# Patient Record
Sex: Female | Born: 1970 | Race: Black or African American | Hispanic: No | Marital: Single | Smoking: Never smoker
Health system: Southern US, Community
[De-identification: ages and names within clinical notes are randomized; demographics above are authoritative.]

## PROBLEM LIST (undated history)

## (undated) DIAGNOSIS — R739 Hyperglycemia, unspecified: Secondary | ICD-10-CM

## (undated) DIAGNOSIS — I10 Essential (primary) hypertension: Secondary | ICD-10-CM

## (undated) DIAGNOSIS — E7800 Pure hypercholesterolemia, unspecified: Secondary | ICD-10-CM

## (undated) HISTORY — DX: Pure hypercholesterolemia, unspecified: E78.00

## (undated) HISTORY — DX: Hyperglycemia, unspecified: R73.9

## (undated) HISTORY — DX: Essential (primary) hypertension: I10

---

## 2000-11-25 ENCOUNTER — Other Ambulatory Visit: Admission: RE | Admit: 2000-11-25 | Discharge: 2000-11-25 | Payer: Self-pay | Admitting: Family Medicine

## 2003-02-21 ENCOUNTER — Other Ambulatory Visit: Admission: RE | Admit: 2003-02-21 | Discharge: 2003-02-21 | Payer: Self-pay | Admitting: Family Medicine

## 2004-04-15 ENCOUNTER — Other Ambulatory Visit: Admission: RE | Admit: 2004-04-15 | Discharge: 2004-04-15 | Payer: Self-pay | Admitting: Family Medicine

## 2005-02-03 ENCOUNTER — Encounter: Admission: RE | Admit: 2005-02-03 | Discharge: 2005-02-03 | Payer: Self-pay | Admitting: Family Medicine

## 2005-04-28 ENCOUNTER — Other Ambulatory Visit: Admission: RE | Admit: 2005-04-28 | Discharge: 2005-04-28 | Payer: Self-pay | Admitting: Family Medicine

## 2005-05-06 ENCOUNTER — Encounter: Admission: RE | Admit: 2005-05-06 | Discharge: 2005-05-06 | Payer: Self-pay | Admitting: Family Medicine

## 2006-03-16 ENCOUNTER — Encounter: Admission: RE | Admit: 2006-03-16 | Discharge: 2006-03-16 | Payer: Self-pay | Admitting: Family Medicine

## 2006-10-07 ENCOUNTER — Encounter: Admission: RE | Admit: 2006-10-07 | Discharge: 2006-10-07 | Payer: Self-pay | Admitting: Family Medicine

## 2007-10-18 ENCOUNTER — Encounter: Admission: RE | Admit: 2007-10-18 | Discharge: 2007-10-18 | Payer: Self-pay | Admitting: Family Medicine

## 2012-06-07 HISTORY — PX: TOTAL LAPAROSCOPIC HYSTERECTOMY WITH SALPINGECTOMY: SHX6742

## 2012-06-07 HISTORY — PX: HYSTERECTOMY ABDOMINAL WITH SALPINGECTOMY: SHX6725

## 2018-08-12 ENCOUNTER — Emergency Department (HOSPITAL_BASED_OUTPATIENT_CLINIC_OR_DEPARTMENT_OTHER): Payer: BC Managed Care – PPO

## 2018-08-12 ENCOUNTER — Emergency Department (HOSPITAL_BASED_OUTPATIENT_CLINIC_OR_DEPARTMENT_OTHER)
Admission: EM | Admit: 2018-08-12 | Discharge: 2018-08-12 | Disposition: A | Discharge status: Home / Self Care | Payer: BC Managed Care – PPO | Attending: Emergency Medicine | Admitting: Emergency Medicine

## 2018-08-12 ENCOUNTER — Other Ambulatory Visit: Payer: Self-pay

## 2018-08-12 ENCOUNTER — Encounter (HOSPITAL_BASED_OUTPATIENT_CLINIC_OR_DEPARTMENT_OTHER): Payer: Self-pay | Admitting: *Deleted

## 2018-08-12 DIAGNOSIS — M25532 Pain in left wrist: Secondary | ICD-10-CM | POA: Diagnosis present

## 2018-08-12 DIAGNOSIS — M79602 Pain in left arm: Secondary | ICD-10-CM | POA: Diagnosis not present

## 2018-08-12 DIAGNOSIS — Z79899 Other long term (current) drug therapy: Secondary | ICD-10-CM | POA: Diagnosis not present

## 2018-08-12 LAB — CBC WITH DIFFERENTIAL/PLATELET
Abs Immature Granulocytes: 0.03 10*3/uL (ref 0.00–0.07)
Basophils Absolute: 0 10*3/uL (ref 0.0–0.1)
Basophils Relative: 1 %
Eosinophils Absolute: 0.1 10*3/uL (ref 0.0–0.5)
Eosinophils Relative: 2 %
HCT: 39 % (ref 36.0–46.0)
Hemoglobin: 12.1 g/dL (ref 12.0–15.0)
Immature Granulocytes: 0 %
Lymphocytes Relative: 38 %
Lymphs Abs: 3.2 10*3/uL (ref 0.7–4.0)
MCH: 25.4 pg — ABNORMAL LOW (ref 26.0–34.0)
MCHC: 31 g/dL (ref 30.0–36.0)
MCV: 81.9 fL (ref 80.0–100.0)
Monocytes Absolute: 0.5 10*3/uL (ref 0.1–1.0)
Monocytes Relative: 6 %
Neutro Abs: 4.6 10*3/uL (ref 1.7–7.7)
Neutrophils Relative %: 53 %
Platelets: 210 10*3/uL (ref 150–400)
RBC: 4.76 MIL/uL (ref 3.87–5.11)
RDW: 14.1 % (ref 11.5–15.5)
Smear Review: ADEQUATE
WBC: 8.5 10*3/uL (ref 4.0–10.5)
nRBC: 0 % (ref 0.0–0.2)

## 2018-08-12 LAB — BASIC METABOLIC PANEL
Anion gap: 8 (ref 5–15)
BUN: 8 mg/dL (ref 6–20)
CO2: 22 mmol/L (ref 22–32)
Calcium: 9.3 mg/dL (ref 8.9–10.3)
Chloride: 105 mmol/L (ref 98–111)
Creatinine, Ser: 0.91 mg/dL (ref 0.44–1.00)
GFR calc Af Amer: >60 mL/min (ref >60)
GFR calc non Af Amer: >60 mL/min (ref >60)
Glucose, Bld: 98 mg/dL (ref 70–99)
Potassium: 3.7 mmol/L (ref 3.5–5.1)
Sodium: 135 mmol/L (ref 135–145)

## 2018-08-12 MED ORDER — NAPROXEN 500 MG PO TABS: 500.0000 mg | ORAL_TABLET | Freq: Two times a day (BID) | ORAL | 0 refills | Status: DC

## 2018-08-12 NOTE — ED Notes (Signed)
RN attempted blood draw was unsuccessful. Another RN going to attempt

## 2018-08-12 NOTE — ED Notes (Signed)
Pt in radiology when rounding

## 2018-08-12 NOTE — ED Notes (Signed)
Blood draw attempted x 1

## 2018-08-12 NOTE — ED Triage Notes (Signed)
Pt has been having arm pain (tingling and numbness in her wrist for a week, then last night she thinks she slept on it wrong causing arm and wrist pain.

## 2018-08-12 NOTE — ED Notes (Signed)
Patient transported to Ultrasound 

## 2018-08-12 NOTE — Discharge Instructions (Addendum)
Work note provided.  Take the Naprosyn as directed for the next 7 days.  Wear the sling on the left arm to rested and for comfort.  Can take it off to shower.  Make an appointment to follow-up with sports medicine upstairs call on Monday.

## 2018-08-12 NOTE — ED Provider Notes (Signed)
[facility] EMERGENCY DEPARTMENT Provider Note   CSN: [id number] Arrival date & time: 08/12/18  1742    History   Chief Complaint Chief Complaint  Patient presents with  . Arm Pain    HPI Julie Frey is a 48 y.o. female.     No history of any injury.  Patient with a complaint of left wrist pain started about a week ago.  Now radiating into the forearm.  And also some discomfort in the shoulder.  Patient recently started a new job could be due to excessive use.  But no known injury.  No prior history of anything similar.  Patient had noticed some pain tongue tingling and numbness to the left wrist area for a week.  Now has some discomfort in the left shoulder no pain with range of motion at the left elbow.  But forearm and wrist with discomfort.  No discomfort with fingers.  No neck pain.  No shortness of breath.  No chest pain.     History reviewed. No pertinent past medical history.  There are no active problems to display for this patient.   History reviewed. No pertinent surgical history.   OB History   No obstetric history on file.      Home Medications    Prior to Admission medications   Medication Sig Start Date End Date Taking? Authorizing Provider  amLODipine (NORVASC) 10 MG tablet Take 10 mg by mouth daily.   Yes [provider]  pravastatin (PRAVACHOL) 20 MG tablet Take 20 mg by mouth daily.   Yes [provider]  naproxen (NAPROSYN) 500 MG tablet Take 1 tablet (500 mg total) by mouth 2 (two) times daily. 08/12/18   Vanetta Mulders, MD    Family History History reviewed. No pertinent family history.  Social History Social History   Tobacco Use  . Smoking status: Never Smoker  . Smokeless tobacco: Never Used  Substance Use Topics  . Alcohol use: Yes    Alcohol/week: 2.0 standard drinks    Types: 2 Glasses of wine per week  . Drug use: Never     Allergies   Patient has no known allergies.   Review of  Systems Review of Systems  Constitutional: Negative for chills and fever.  HENT: Negative for rhinorrhea and sore throat.   Eyes: Negative for visual disturbance.  Respiratory: Negative for cough and shortness of breath.   Cardiovascular: Negative for chest pain and leg swelling.  Gastrointestinal: Negative for abdominal pain, diarrhea, nausea and vomiting.  Genitourinary: Negative for dysuria.  Musculoskeletal: Positive for joint swelling. Negative for back pain and neck pain.  Skin: Negative for rash and wound.  Neurological: Negative for dizziness, light-headedness and headaches.  Hematological: Does not bruise/bleed easily.  Psychiatric/Behavioral: Negative for confusion.     Physical Exam Updated Vital Signs BP (!) 143/82 (BP Location: Right Arm)   Pulse 69   Temp 98.4 F (36.9 C) (Oral)   Resp 18   Ht 1.689 m (5' 6.5")   Wt 113.4 kg   SpO2 100%   BMI 39.75 kg/m   Physical Exam Vitals signs and nursing note reviewed.  Constitutional:      General: She is not in acute distress.    Appearance: Normal appearance. She is well-developed.  HENT:     Head: Normocephalic and atraumatic.     Mouth/Throat:     Mouth: Mucous membranes are moist.  Eyes:     Extraocular Movements: Extraocular movements intact.  Conjunctiva/sclera: Conjunctivae normal.     Pupils: Pupils are equal, round, and reactive to light.  Neck:     Musculoskeletal: Normal range of motion and neck supple.  Cardiovascular:     Rate and Rhythm: Normal rate and regular rhythm.     Heart sounds: Normal heart sounds. No murmur.  Pulmonary:     Effort: Pulmonary effort is normal. No respiratory distress.     Breath sounds: Normal breath sounds.  Abdominal:     General: Bowel sounds are normal.     Palpations: Abdomen is soft.     Tenderness: There is no abdominal tenderness.  Musculoskeletal:        General: Swelling and tenderness present.     Comments: Left hand wrist and forearm with some  swelling and increased warmth.  Good range of motion at the elbow some discomfort with range of motion of the shoulder.  No pain with range of motion of fingers some discomfort with range of motion of the wrist.  Cap refill intact radial pulses 2+.  Skin:    General: Skin is warm and dry.     Capillary Refill: Capillary refill takes less than 2 seconds.  Neurological:     General: No focal deficit present.     Mental Status: She is alert and oriented to person, place, and time.     Cranial Nerves: No cranial nerve deficit.     Sensory: No sensory deficit.     Motor: No weakness.      ED Treatments / Results  Labs (all labs ordered are listed, but only abnormal results are displayed) Labs Reviewed  CBC WITH DIFFERENTIAL/PLATELET - Abnormal; Notable for the following components:      Result Value   MCH 25.4 (*)    All other components within normal limits  BASIC METABOLIC PANEL    EKG None  Radiology Dg Forearm Left  Result Date: 08/12/2018 CLINICAL DATA:  Pain and swelling. Left wrist pain x 1 week that has now radiated up to left shoulder, no known injury. EXAM: LEFT FOREARM - 2 VIEW COMPARISON:  None. FINDINGS: There is no evidence of fracture or other focal bone lesions. Soft tissues are unremarkable. IMPRESSION: Negative radiographs of the left forearm. Electronically Signed   By: Narda Rutherford M.D.   On: 08/12/2018 19:50   Dg Wrist Complete Left  Result Date: 08/12/2018 CLINICAL DATA:  Pain and swelling. Left wrist pain x 1 week that has now radiated up to left shoulder, no known injury. EXAM: LEFT WRIST - COMPLETE 3+ VIEW COMPARISON:  None. FINDINGS: There is no evidence of fracture or dislocation. There is no evidence of arthropathy or other focal bone abnormality. Soft tissues are unremarkable. IMPRESSION: Negative radiographs of the left wrist. Electronically Signed   By: Narda Rutherford M.D.   On: 08/12/2018 19:51   US Venous Img Upper Left (dvt Study)  Result Date:  08/12/2018 CLINICAL DATA:  Pain and swelling. Left wrist pain x 1 week that has now radiated up to left shoulder, no known injury. EXAM: LEFT UPPER EXTREMITY VENOUS DOPPLER ULTRASOUND TECHNIQUE: Gray-scale sonography with graded compression, as well as color Doppler and duplex ultrasound were performed to evaluate the upper extremity deep venous system from the level of the subclavian vein and including the jugular, axillary, basilic, radial, ulnar and upper cephalic vein. Spectral Doppler was utilized to evaluate flow at rest and with distal augmentation maneuvers. COMPARISON:  None. FINDINGS: Contralateral Subclavian Vein: Respiratory phasicity is normal and  symmetric with the symptomatic side. No evidence of thrombus. Normal compressibility. Internal Jugular Vein: No evidence of thrombus. Normal compressibility, respiratory phasicity and response to augmentation. Subclavian Vein: No evidence of thrombus. Normal compressibility, respiratory phasicity and response to augmentation. Axillary Vein: No evidence of thrombus. Normal compressibility, respiratory phasicity and response to augmentation. Cephalic Vein: No evidence of thrombus. Normal compressibility, respiratory phasicity and response to augmentation. Basilic Vein: No evidence of thrombus. Normal compressibility, respiratory phasicity and response to augmentation. Brachial Veins: No evidence of thrombus. Normal compressibility, respiratory phasicity and response to augmentation. Radial Veins: No evidence of thrombus. Normal compressibility, respiratory phasicity and response to augmentation. Ulnar Veins: No evidence of thrombus. Normal compressibility, respiratory phasicity and response to augmentation. Venous Reflux:  None visualized. Other Findings:  None visualized. IMPRESSION: No evidence of DVT within the left upper extremity. Electronically Signed   By: Narda Rutherford M.D.   On: 08/12/2018 19:53    Procedures Procedures (including critical care  time)  Medications Ordered in ED Medications - No data to display   Initial Impression / Assessment and Plan / ED Course  I have reviewed the triage vital signs and the nursing notes.  Pertinent labs & imaging results that were available during my care of the patient were reviewed by me and considered in my medical decision making (see chart for details).        Work-up for the left arm pain and swelling some increased warmth without any acute findings.  Doppler studies negative for DVT x-ray of wrist and forearm without any bony abnormalities.  Could perhaps be just overuse.  Labs are normal no leukocytosis electrolytes are normal kidney functions normal.  Will treat with a sling 7-day course of Naprosyn and follow-up with sports medicine.  Final Clinical Impressions(s) / ED Diagnoses   Final diagnoses:  Left arm pain    ED Discharge Orders         Ordered    naproxen (NAPROSYN) 500 MG tablet  2 times daily     08/12/18 2059           Vanetta Mulders, MD 08/12/18 2105

## 2018-08-21 ENCOUNTER — Ambulatory Visit: Payer: BC Managed Care – PPO | Admitting: Family Medicine

## 2018-08-28 ENCOUNTER — Ambulatory Visit: Payer: BC Managed Care – PPO | Admitting: Family Medicine

## 2020-03-03 IMAGING — CR DG WRIST COMPLETE 3+V*L*
4 series · 4 of 4 positions shown · non-contrast
Comparison: None.

CLINICAL DATA: Pain and swelling. Left wrist pain x 1 week that has
now radiated up to left shoulder, no known injury.

EXAM:
LEFT WRIST - COMPLETE 3+ VIEW

[x wrist pa left]
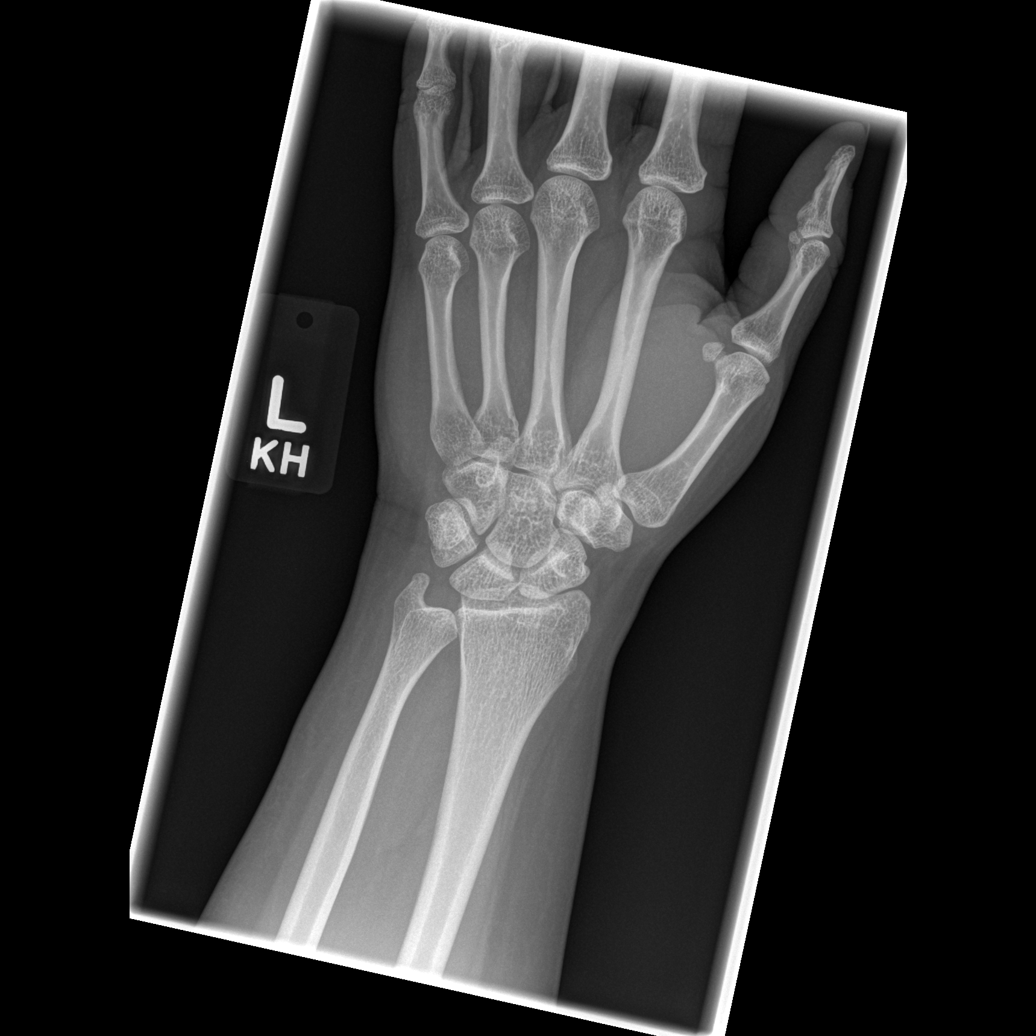

[x wrist obl left]
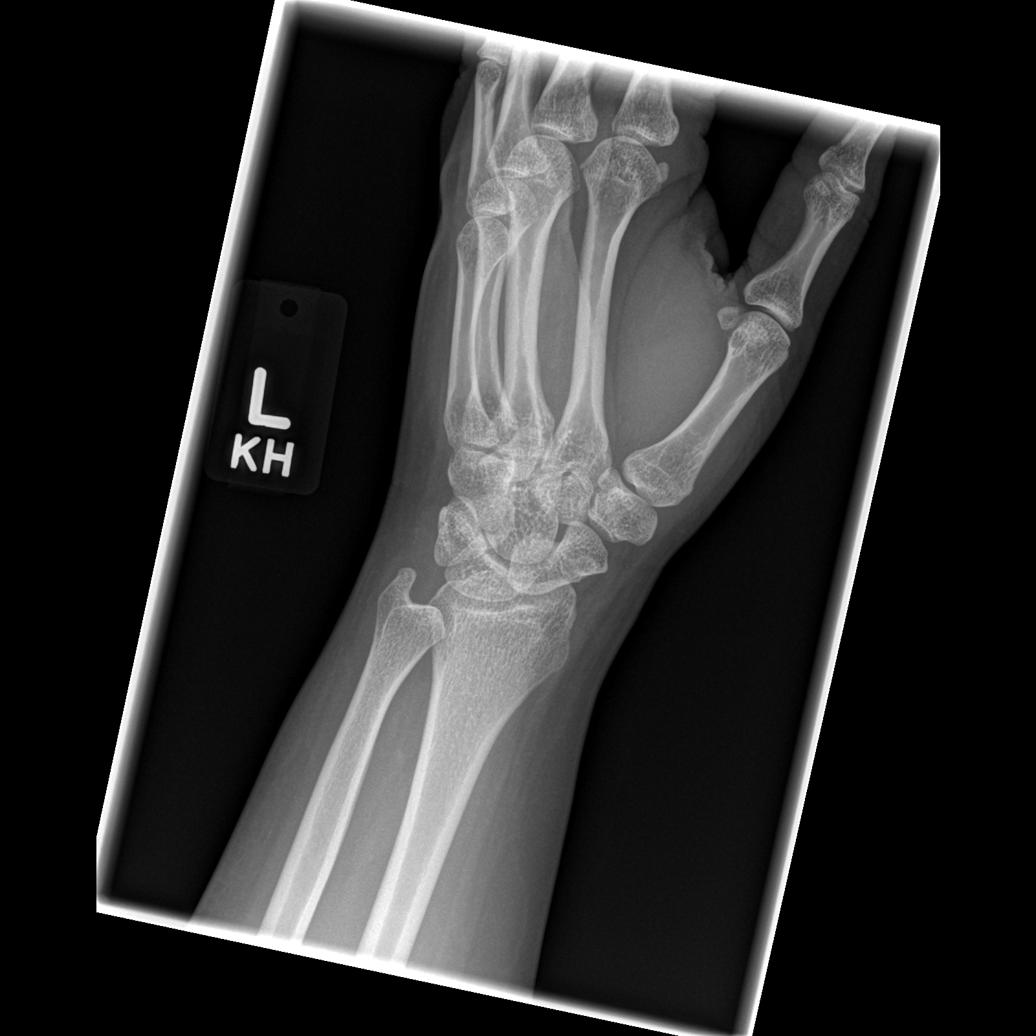

[x wrist lat left]
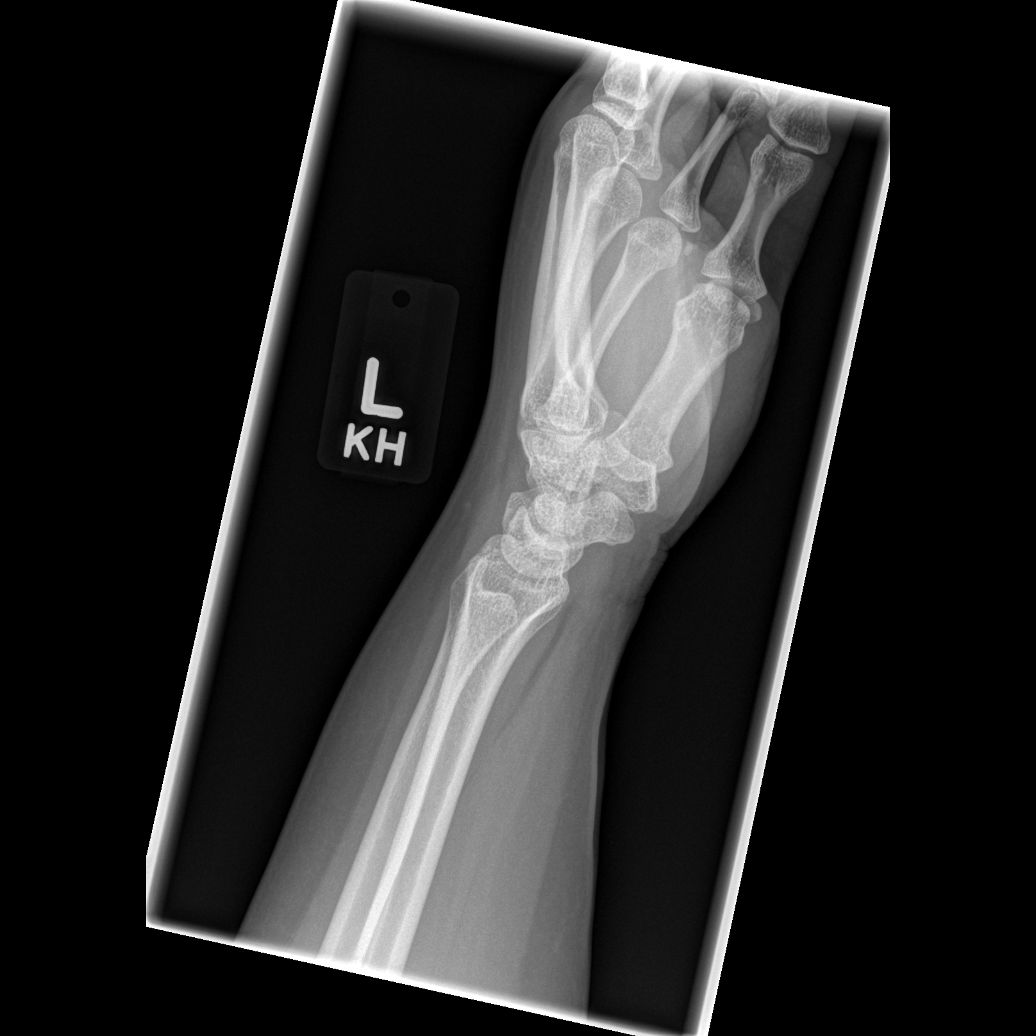

[x navicular]
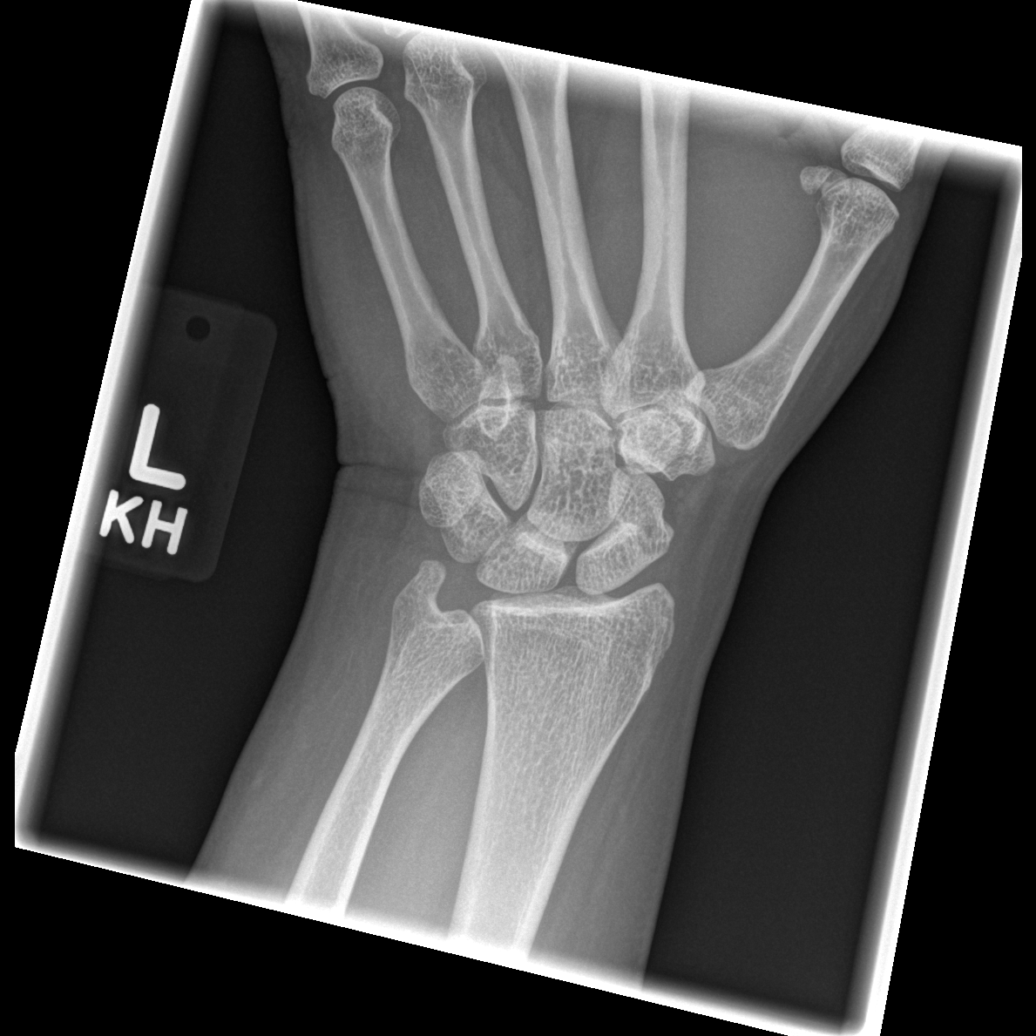

[4 of 4 positions shown; findings below may reference images not displayed]

FINDINGS: There is no evidence of fracture or dislocation. There is no
evidence of arthropathy or other focal bone abnormality. Soft
tissues are unremarkable.
IMPRESSION: Negative radiographs of the left wrist.

## 2020-03-03 IMAGING — CR DG FOREARM 2V*L*
2 series · 2 of 2 positions shown · non-contrast
Comparison: None.

CLINICAL DATA: Pain and swelling. Left wrist pain x 1 week that has
now radiated up to left shoulder, no known injury.

EXAM:
LEFT FOREARM - 2 VIEW

[x forearm ap left]
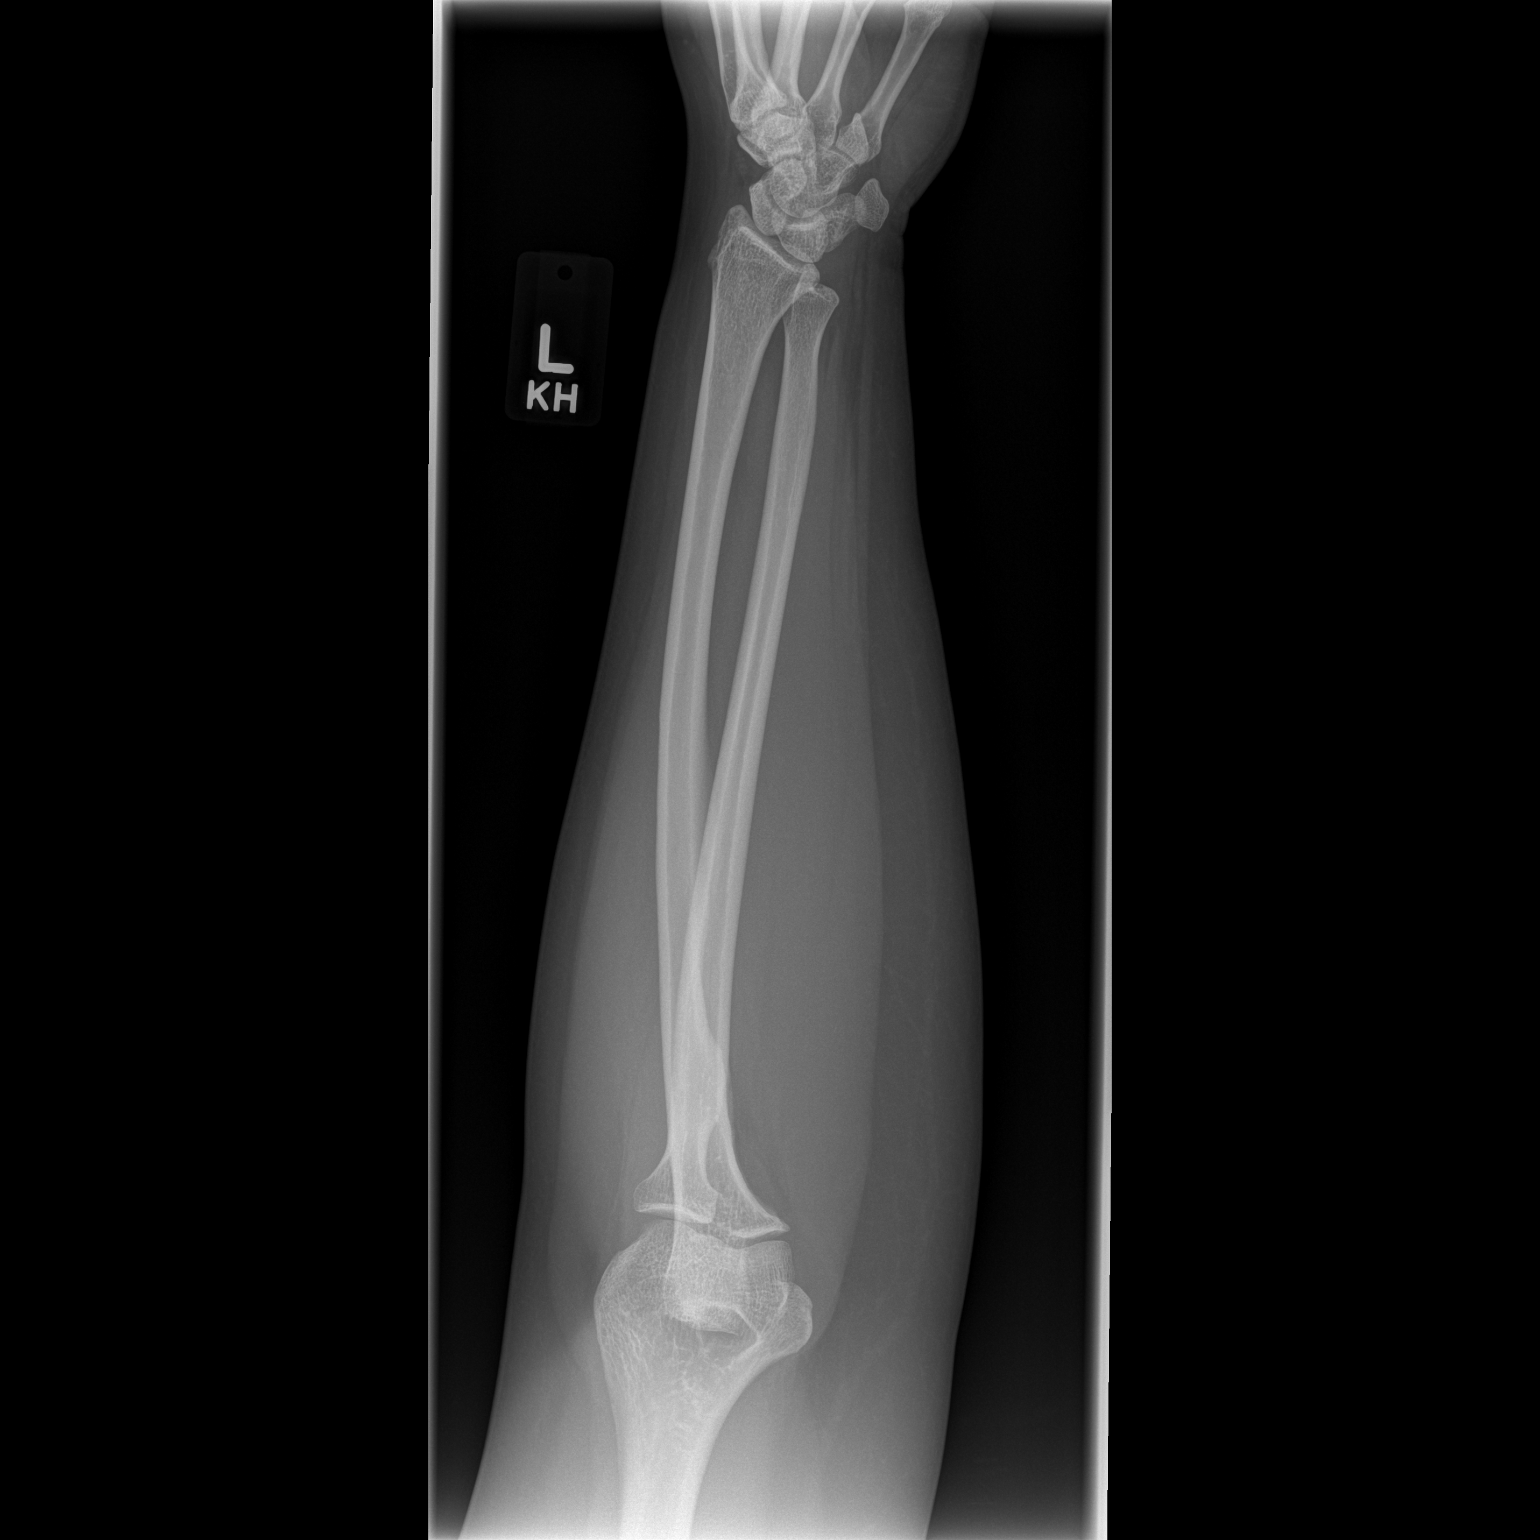

[x forearm lat left]
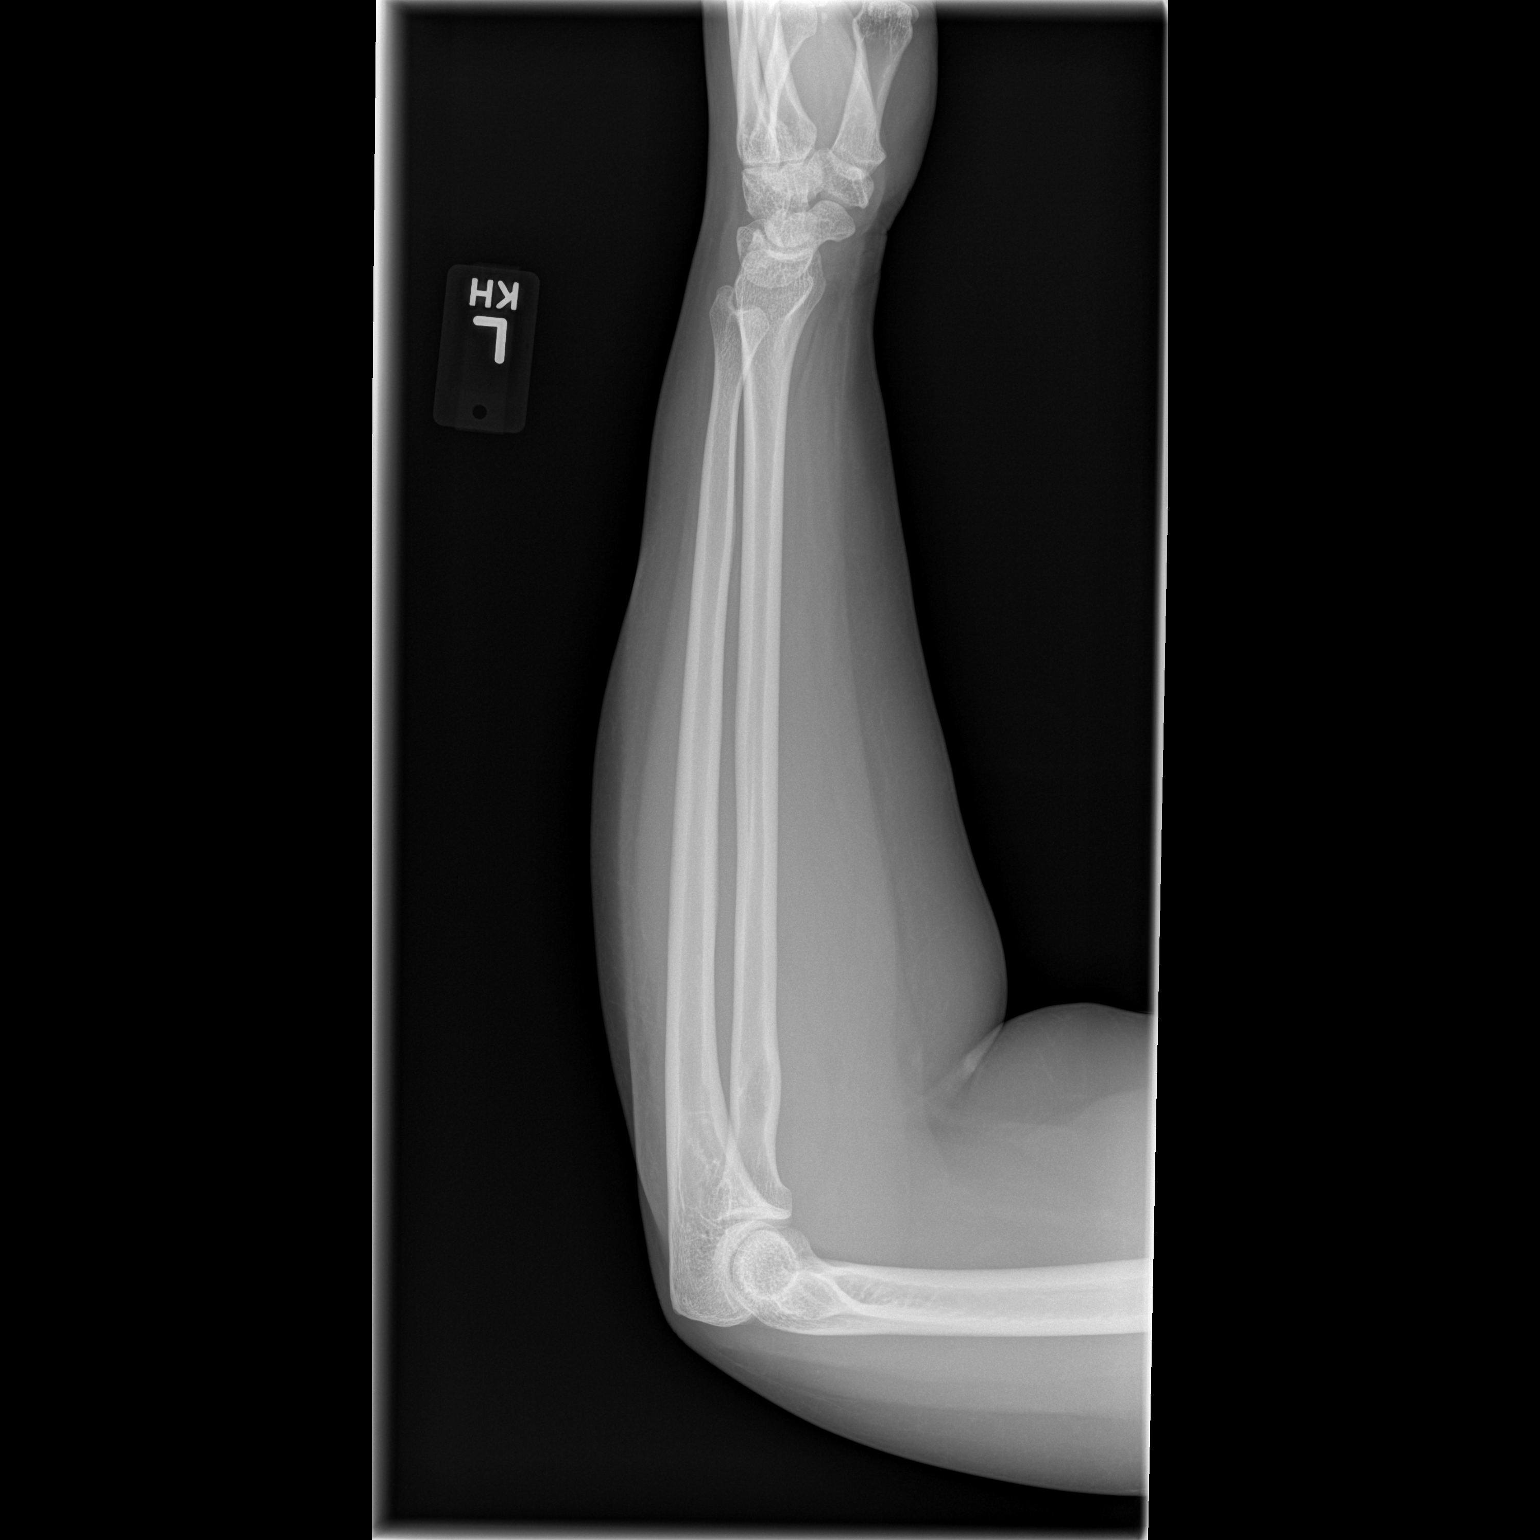

[2 of 2 positions shown; findings below may reference images not displayed]

FINDINGS: There is no evidence of fracture or other focal bone lesions. Soft
tissues are unremarkable.
IMPRESSION: Negative radiographs of the left forearm.

## 2022-09-09 ENCOUNTER — Ambulatory Visit: Payer: Managed Care, Other (non HMO) | Admitting: Podiatry

## 2022-09-09 ENCOUNTER — Ambulatory Visit: Payer: Managed Care, Other (non HMO)

## 2022-09-09 ENCOUNTER — Encounter: Payer: Self-pay | Admitting: Podiatry

## 2022-09-09 DIAGNOSIS — M79671 Pain in right foot: Secondary | ICD-10-CM

## 2022-09-09 DIAGNOSIS — M7989 Other specified soft tissue disorders: Secondary | ICD-10-CM | POA: Diagnosis not present

## 2022-09-09 DIAGNOSIS — M2141 Flat foot [pes planus] (acquired), right foot: Secondary | ICD-10-CM | POA: Diagnosis not present

## 2022-09-09 DIAGNOSIS — M2142 Flat foot [pes planus] (acquired), left foot: Secondary | ICD-10-CM | POA: Diagnosis not present

## 2022-09-09 DIAGNOSIS — G5753 Tarsal tunnel syndrome, bilateral lower limbs: Secondary | ICD-10-CM | POA: Diagnosis not present

## 2022-09-09 MED ORDER — MELOXICAM 15 MG PO TABS: 15.0000 mg | ORAL_TABLET | Freq: Every day | ORAL | 0 refills | Status: DC

## 2022-09-09 NOTE — Progress Notes (Signed)
  Subjective:  Patient ID: Julie Frey, female    DOB: [birth year],   MRN: [id number]  Chief Complaint  Patient presents with   Cyst    Right foot cyst, growing for years pain can be constant.patient states the ball of there foot has a tingling sensation that's constant when pressure is applied    52 y.o. female presents for concern of right foot cyst that she has had for years as well as pain in the arch of her foot that started several months ago and new pain in the ball of both her feet that started several weeks ago. Relates the cyst has not changes. States she wears good supportive shoes. Takes anti-inflammatories when needed .Relates the pain is mostly pulling type pain and burning and tinlging in the ball of her foot. Denies any back pain.  Denies any other pedal complaints. Denies n/v/f/c.   No past medical history on file.  Objective:  Physical Exam: Vascular: DP/PT pulses 2/4 bilateral. CFT <3 seconds. Normal hair growth on digits. No edema.  Skin. No lacerations or abrasions bilateral feet. Right foot firm yet fluctuant mass about 3 cm without pain to palpation. Noted to medial foot in just proximal and plantar to navicular tuberosity.  Musculoskeletal: MMT 5/5 bilateral lower extremities in DF, PF, Inversion and Eversion. Deceased ROM in DF of ankle joint. No pain to palpation about the Medial arch or plantar foot. No pain in ball of foot.  Neurological: Sensation intact to light touch.  Mildly positive tinels sign. Negative valleix.   Assessment:   1. Bilateral pes planus   2. Mass of soft tissue of foot   3. 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 -Xrays reviewed. No acute fractures or dislocations. Mild collapse of the arch on right foot.  -Discussed treatement options; discussed pes planus deformity and possible tarsal tunnel syndrome vs neuritis. Also discussed soft tissue mass  ganglion vs lipoma;conservative and  surgical  -Powersteps dispensed.  -Recommend good supportive shoes -Recommend daily stretching and icing -Meloxicam prescribed to see if this helps with any possible inflamamtion. Kidney function looks normal. CMP reviewed.  -Discussed as cysts has not changes and not painful will continue to monitor.  -Dicussed if continued pain after inserts would possibly consider NCV to check nerve health. -Patient to return to office as needed or sooner if condition worsens.   Lorenda Peck, DPM

## 2022-09-13 ENCOUNTER — Other Ambulatory Visit: Payer: Self-pay | Admitting: Podiatry

## 2022-11-19 LAB — HM MAMMOGRAPHY

## 2023-04-18 DIAGNOSIS — I10 Essential (primary) hypertension: Secondary | ICD-10-CM | POA: Diagnosis not present

## 2023-04-18 DIAGNOSIS — E669 Obesity, unspecified: Secondary | ICD-10-CM | POA: Diagnosis not present

## 2023-04-18 DIAGNOSIS — E7800 Pure hypercholesterolemia, unspecified: Secondary | ICD-10-CM | POA: Diagnosis not present

## 2023-04-18 DIAGNOSIS — R7303 Prediabetes: Secondary | ICD-10-CM | POA: Diagnosis not present

## 2023-07-27 DIAGNOSIS — I10 Essential (primary) hypertension: Secondary | ICD-10-CM | POA: Diagnosis not present

## 2023-07-27 DIAGNOSIS — Z1211 Encounter for screening for malignant neoplasm of colon: Secondary | ICD-10-CM | POA: Diagnosis not present

## 2023-07-27 DIAGNOSIS — R7303 Prediabetes: Secondary | ICD-10-CM | POA: Diagnosis not present

## 2023-07-27 DIAGNOSIS — Z6841 Body Mass Index (BMI) 40.0 and over, adult: Secondary | ICD-10-CM | POA: Diagnosis not present

## 2023-08-22 DIAGNOSIS — E7800 Pure hypercholesterolemia, unspecified: Secondary | ICD-10-CM | POA: Diagnosis not present

## 2023-09-08 ENCOUNTER — Other Ambulatory Visit (HOSPITAL_BASED_OUTPATIENT_CLINIC_OR_DEPARTMENT_OTHER): Payer: Self-pay

## 2023-09-08 MED ORDER — REPATHA SURECLICK 140 MG/ML ~~LOC~~ SOAJ
140.0000 mg | SUBCUTANEOUS | 4 refills | Status: AC
  Filled 2023-09-08: qty 6, 84d supply, fill #0
  Filled 2024-01-05: qty 6, 84d supply, fill #1
  Filled 2024-03-25: qty 6, 84d supply, fill #2

## 2023-11-01 DIAGNOSIS — Z1211 Encounter for screening for malignant neoplasm of colon: Secondary | ICD-10-CM | POA: Diagnosis not present

## 2023-11-01 DIAGNOSIS — K648 Other hemorrhoids: Secondary | ICD-10-CM | POA: Diagnosis not present

## 2023-11-01 LAB — HM COLONOSCOPY

## 2023-11-01 LAB — HM MAMMOGRAPHY

## 2024-01-10 ENCOUNTER — Ambulatory Visit: Admitting: Family

## 2024-01-10 ENCOUNTER — Other Ambulatory Visit (HOSPITAL_BASED_OUTPATIENT_CLINIC_OR_DEPARTMENT_OTHER): Payer: Self-pay

## 2024-01-10 ENCOUNTER — Encounter: Payer: Self-pay | Admitting: Family

## 2024-01-10 ENCOUNTER — Other Ambulatory Visit: Payer: Self-pay

## 2024-01-10 VITALS — BP 142/78 | HR 65 | Temp 98.1°F | Resp 16 | Ht 66.5 in | Wt 262.0 lb

## 2024-01-10 DIAGNOSIS — E785 Hyperlipidemia, unspecified: Secondary | ICD-10-CM

## 2024-01-10 DIAGNOSIS — Z1159 Encounter for screening for other viral diseases: Secondary | ICD-10-CM

## 2024-01-10 DIAGNOSIS — R739 Hyperglycemia, unspecified: Secondary | ICD-10-CM | POA: Diagnosis not present

## 2024-01-10 DIAGNOSIS — Z114 Encounter for screening for human immunodeficiency virus [HIV]: Secondary | ICD-10-CM

## 2024-01-10 DIAGNOSIS — I10 Essential (primary) hypertension: Secondary | ICD-10-CM | POA: Diagnosis not present

## 2024-01-10 LAB — COMPREHENSIVE METABOLIC PANEL WITH GFR
ALT: 8 U/L (ref 0–35)
AST: 14 U/L (ref 0–37)
Albumin: 4.3 g/dL (ref 3.5–5.2)
Alkaline Phosphatase: 89 U/L (ref 39–117)
BUN: 8 mg/dL (ref 6–23)
CO2: 29 meq/L (ref 19–32)
Calcium: 9.6 mg/dL (ref 8.4–10.5)
Chloride: 102 meq/L (ref 96–112)
Creatinine, Ser: 0.86 mg/dL (ref 0.40–1.20)
GFR: 78.62 mL/min (ref >60.00)
Glucose, Bld: 104 mg/dL — ABNORMAL HIGH (ref 70–99)
Potassium: 4 meq/L (ref 3.5–5.1)
Sodium: 137 meq/L (ref 135–145)
Total Bilirubin: 0.4 mg/dL (ref 0.2–1.2)
Total Protein: 7.6 g/dL (ref 6.0–8.3)

## 2024-01-10 LAB — LIPID PANEL
Cholesterol: 226 mg/dL — ABNORMAL HIGH (ref 0–200)
HDL: 45.8 mg/dL (ref >39.00)
LDL Cholesterol: 150 mg/dL — ABNORMAL HIGH (ref 0–99)
NonHDL: 179.87
Total CHOL/HDL Ratio: 5
Triglycerides: 149 mg/dL (ref 0.0–149.0)
VLDL: 29.8 mg/dL (ref 0.0–40.0)

## 2024-01-10 LAB — HEMOGLOBIN A1C: Hgb A1c MFr Bld: 6.4 % (ref 4.6–6.5)

## 2024-01-10 MED ORDER — VALSARTAN 160 MG PO TABS
160.0000 mg | ORAL_TABLET | Freq: Every day | ORAL | 3 refills | Status: AC
  Filled 2024-01-10: qty 90, 90d supply, fill #0
  Filled 2024-05-18: qty 90, 90d supply, fill #1

## 2024-01-10 MED ORDER — AMLODIPINE BESYLATE 10 MG PO TABS
5.0000 mg | ORAL_TABLET | Freq: Every day | ORAL | Status: DC
Start: 2024-01-10 — End: 2024-05-02

## 2024-01-10 NOTE — Assessment & Plan Note (Signed)
 On Repatha - lipids have not been checked since she started. Check lipids today.

## 2024-01-10 NOTE — Patient Instructions (Addendum)
 VISIT SUMMARY:  Today, we discussed your hypertension, hyperlipidemia, and prediabetes. We made some changes to your medications and recommended lifestyle modifications to help manage your conditions.  YOUR PLAN:  HYPERTENSION WITH EDEMA: Your blood pressure is being managed with amlodipine , but it causes swelling, especially at higher doses. -Reduce amlodipine  to 5 mg by cutting your 10 mg tablets in half. -Add valsartan  to help control your blood pressure. -Continue taking hydrochlorothiazide to manage the swelling and help with blood pressure control. -Follow up in two weeks to recheck your blood pressure.  HYPERLIPIDEMIA: Your high cholesterol is being managed with Repatha , which you tolerate well. -A lipid panel has been ordered to check your cholesterol levels. -The results will be sent to Dr. Launie for review.  PREDIABETES: Your previous A1c was 6.2%, which decreased to 5.8% with Mounjaro. -An A1c test has been ordered to evaluate your blood sugar control. -Make lifestyle changes including regular exercise and dietary adjustments to limit white, fluffy carbohydrates and sugars.

## 2024-01-10 NOTE — Assessment & Plan Note (Deleted)
  Hypertension managed with amlodipine  10 mg, causing significant edema. Hydrochlorothiazide helps with edema and blood pressure control.  - Reduce amlodipine  to 5 mg by cutting 10 mg tablets in half. - Add valsartan  for blood pressure control.  - Continue hydrochlorothiazide for edema and blood pressure.  - Follow-up in two weeks for blood pressure recheck.

## 2024-01-10 NOTE — Progress Notes (Signed)
 Subjective:     Patient ID: Julie Frey, female    DOB: 01-02-1971, 53 y.o.   MRN: [id number]  Chief Complaint  Patient presents with   New Patient (Initial Visit)         HPI  Discussed the use of AI scribe software for clinical note transcription with the patient, who gave verbal consent to proceed.  History of Present Illness  CHRISTIAN TREADWAY is a 53 year old female with hypertension and hyperlipidemia who presents to establish care. She was previously followed by a PCP at [facility].  Her concern today is blood pressure management. She experiences significant swelling with amlodipine , especially at the 10 mg dose. Hydrochlorothiazide has been used to manage the swelling but is difficult to take consistently. Her blood pressure averages around 140 mmHg. She takes Repatha  for high cholesterol due to statin intolerance, which previously caused leg pain and affected her exercise routine. She tolerates Repatha  well. She rarely consumes alcohol, does not use drugs, and does not use tobacco or vape. She has a female partner. Her last A1c was 6.2, and it decreased to 5.8 with Mounjaro, which she discontinued due to nausea and vomiting.      Health Maintenance Due  Topic Date Due   HIV Screening  Never done   Hepatitis C Screening  Never done   Hepatitis B Vaccines (1 of 3 - 19+ 3-dose series) Never done   Cervical Cancer Screening (HPV/Pap Cotest)  Never done   COVID-19 Vaccine (4 - 2024-25 season) 02/06/2023   Pneumococcal Vaccine: 50+ Years (1 of 1 - PCV) Never done   MAMMOGRAM  Never done   Zoster Vaccines- Shingrix (1 of 2) Never done   INFLUENZA VACCINE  01/06/2024    Past Medical History:  Diagnosis Date   Hypercholesterolemia    Hypertension     Past Surgical History:  Procedure Laterality Date   HYSTERECTOMY ABDOMINAL WITH SALPINGECTOMY  2014    Family History  Problem Relation Age of Onset   Cancer - Colon Mother    Hypertension Father    Heart disease  Father    Huntington's disease Father        huntingtons like illness   Hypertension Sister    Deep vein thrombosis Sister     Social History   Socioeconomic History   Marital status: Single    Spouse name: Not on file   Number of children: Not on file   Years of education: Not on file   Highest education level: Not on file  Occupational History   Not on file  Tobacco Use   Smoking status: Never   Smokeless tobacco: Never  Vaping Use   Vaping status: Never Used  Substance and Sexual Activity   Alcohol use: Yes    Alcohol/week: 2.0 standard drinks of alcohol    Types: 2 Glasses of wine per week    Comment: once a month   Drug use: Never   Sexual activity: Yes    Partners: Male  Other Topics Concern   Not on file  Social History Narrative   Single, niece lives with her   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)   Cares for her father who is living independently   No pets   Enjoys napping, spending time with family and friends   Completed Masters Training and development officer in [employer]   No pets   Social Drivers of Corporate investment banker Strain: Not on file  Food Insecurity:  Low Risk  (07/27/2023)   Received from [facility]   Hunger Vital Sign    Within the past 12 months, you worried that your food would run out before you got money to buy more: Never true    Within the past 12 months, the food you bought just didn't last and you didn't have money to get more. : Never true  Transportation Needs: No Transportation Needs (07/27/2023)   Received from [facility]    In the past 12 months, has lack of reliable transportation kept you from medical appointments, meetings, work or from getting things needed for daily living? : No  Physical Activity: Not on file  Stress: Not on file  Social Connections: Unknown (10/18/2021)   Received from [facility]   Social Network    Social Network: Not on file  Intimate Partner Violence: Unknown (09/09/2021)    Received from [facility]   HITS    Physically Hurt: Not on file    Insult or Talk Down To: Not on file    Threaten Physical Harm: Not on file    Scream or Curse: Not on file    Outpatient Medications Prior to Visit  Medication Sig Dispense Refill   Evolocumab  (REPATHA  SURECLICK) 140 MG/ML SOAJ Inject 1 mL (140 mg total) into the skin every 14 (fourteen) days. 6 mL 4   hydrochlorothiazide (HYDRODIURIL) 25 MG tablet Take 25 mg by mouth daily.     amLODipine  (NORVASC ) 10 MG tablet Take 10 mg by mouth daily.     meloxicam  (MOBIC ) 15 MG tablet Take 1 tablet (15 mg total) by mouth daily. 30 tablet 0   pravastatin (PRAVACHOL) 20 MG tablet Take 20 mg by mouth daily.     No facility-administered medications prior to visit.    No Known Allergies  ROS    See HPI Objective:    Physical Exam Constitutional:      General: She is not in acute distress.    Appearance: Normal appearance. She is well-developed.  HENT:     Head: Normocephalic and atraumatic.     Right Ear: External ear normal.     Left Ear: External ear normal.  Eyes:     General: No scleral icterus. Neck:     Thyroid : No thyromegaly.  Cardiovascular:     Rate and Rhythm: Normal rate and regular rhythm.     Heart sounds: Normal heart sounds. No murmur heard. Pulmonary:     Effort: Pulmonary effort is normal. No respiratory distress.     Breath sounds: Normal breath sounds. No wheezing.  Musculoskeletal:     Cervical back: Neck supple.  Skin:    General: Skin is warm and dry.  Neurological:     Mental Status: She is alert and oriented to person, place, and time.  Psychiatric:        Mood and Affect: Mood normal.        Behavior: Behavior normal.        Thought Content: Thought content normal.        Judgment: Judgment normal.      BP (!) 142/78 (BP Location: Right Arm, Patient Position: Sitting)   Pulse 65   Temp 98.1 F (36.7 C) (Oral)   Resp 16   Ht 5' 6.5 (1.689 m)   Wt 262 lb (118.8 kg)   SpO2  100%   BMI 41.65 kg/m  Wt Readings from Last 3 Encounters:  01/10/24 262 lb (118.8 kg)  08/12/18  250 lb (113.4 kg)            Assessment & Plan:        Assessment & Plan:   Problem List Items Addressed This Visit       Unprioritized   Primary hypertension - Primary    Hypertension managed with amlodipine  10 mg, causing significant edema. Hydrochlorothiazide helps with edema and blood pressure control.  - Reduce amlodipine  to 5 mg by cutting 10 mg tablets in half. - Add valsartan  for blood pressure control.  - Continue hydrochlorothiazide for edema and blood pressure.  - Follow-up in two weeks for blood pressure recheck.       Relevant Medications   hydrochlorothiazide (HYDRODIURIL) 25 MG tablet   amLODipine  (NORVASC ) 10 MG tablet   valsartan  (DIOVAN ) 160 MG tablet   Hyperlipidemia   On Repatha - lipids have not been checked since she started. Check lipids today.       Relevant Medications   hydrochlorothiazide (HYDRODIURIL) 25 MG tablet   amLODipine  (NORVASC ) 10 MG tablet   valsartan  (DIOVAN ) 160 MG tablet   Other Relevant Orders   Lipid panel   Hyperglycemia    Prediabetes with previous A1c of 6.2%, decreased to 5.8% with Mounjaro which she had to discontinue due to side effects.Lifestyle modifications recommended for blood sugar management.  - Order A1c test to evaluate blood sugar control.  - Advise on lifestyle modifications including exercise and dietary changes to limit white, fluffy carbohydrates and sugars.       Relevant Orders   HgB A1c   Comp Met (CMET)   Other Visit Diagnoses       Encounter for hepatitis C screening test for low risk patient       Relevant Orders   Hepatitis C Antibody     Encounter for screening for HIV       Relevant Orders   HIV antibody (with reflex)       I have discontinued Lainie D. Harshfield's pravastatin and meloxicam . I have also changed her amLODipine . Additionally, I am having her start on valsartan . Lastly,  I am having her maintain her Repatha  SureClick and hydrochlorothiazide.  Meds ordered this encounter  Medications   amLODipine  (NORVASC ) 10 MG tablet    Sig: Take 0.5 tablets (5 mg total) by mouth daily.    Supervising Provider:   DOMENICA BLACKBIRD A [[id number]]   valsartan  (DIOVAN ) 160 MG tablet    Sig: Take 1 tablet (160 mg total) by mouth daily.    Dispense:  90 tablet    Refill:  3    Supervising Provider:   DOMENICA BLACKBIRD A [[id number]]

## 2024-01-10 NOTE — Assessment & Plan Note (Signed)
  Prediabetes with previous A1c of 6.2%, decreased to 5.8% with Mounjaro which she had to discontinue due to side effects.Lifestyle modifications recommended for blood sugar management.  - Order A1c test to evaluate blood sugar control.  - Advise on lifestyle modifications including exercise and dietary changes to limit white, fluffy carbohydrates and sugars.

## 2024-01-10 NOTE — Assessment & Plan Note (Signed)
  Hypertension managed with amlodipine  10 mg, causing significant edema. Hydrochlorothiazide helps with edema and blood pressure control.  - Reduce amlodipine  to 5 mg by cutting 10 mg tablets in half. - Add valsartan  for blood pressure control.  - Continue hydrochlorothiazide for edema and blood pressure.  - Follow-up in two weeks for blood pressure recheck.

## 2024-01-11 ENCOUNTER — Ambulatory Visit: Payer: Self-pay | Admitting: Family

## 2024-01-11 ENCOUNTER — Encounter: Payer: Self-pay | Admitting: Family

## 2024-01-11 LAB — HIV ANTIBODY (ROUTINE TESTING W REFLEX): HIV 1&2 Ab, 4th Generation: NONREACTIVE

## 2024-01-11 LAB — HEPATITIS C ANTIBODY: Hepatitis C Ab: NONREACTIVE

## 2024-01-12 ENCOUNTER — Other Ambulatory Visit: Payer: Self-pay

## 2024-01-12 ENCOUNTER — Telehealth: Payer: Self-pay | Admitting: Family

## 2024-01-12 NOTE — Telephone Encounter (Signed)
 Labs faxed

## 2024-01-12 NOTE — Telephone Encounter (Signed)
 Please fax labs to Dr. Alexa Counter.

## 2024-01-23 ENCOUNTER — Other Ambulatory Visit: Payer: Self-pay | Admitting: Family

## 2024-01-23 DIAGNOSIS — Z1231 Encounter for screening mammogram for malignant neoplasm of breast: Secondary | ICD-10-CM

## 2024-01-25 ENCOUNTER — Ambulatory Visit (INDEPENDENT_AMBULATORY_CARE_PROVIDER_SITE_OTHER): Admitting: Family

## 2024-01-25 VITALS — BP 128/73 | HR 69 | Temp 98.5°F | Resp 16 | Ht 66.5 in | Wt 258.0 lb

## 2024-01-25 DIAGNOSIS — I10 Essential (primary) hypertension: Secondary | ICD-10-CM

## 2024-01-25 NOTE — Patient Instructions (Signed)
 VISIT SUMMARY:  You came in for a follow-up on your blood pressure management. Your blood pressure is improving, and the swelling in your legs has decreased with the new medication regimen.  YOUR PLAN:  HYPERTENSION WITH LOWER EXTREMITY EDEMA: Your blood pressure is controlled with your current medications, and the swelling in your legs has improved. -Continue taking amlodipine  5 mg daily. -Continue taking valsartan  as prescribed. -Use hydrochlorothiazide as needed for swelling. -We will order lab tests to check your kidney function and potassium levels. -Recheck your blood pressure in 3 months. -Schedule a physical exam in 3 months.

## 2024-01-25 NOTE — Assessment & Plan Note (Signed)
  Hypertension controlled with amlodipine  and valsartan . Edema improved with medication adjustment. Discussed valsartan 's rare potential to increase potassium. - Continue amlodipine  5 mg daily. - Continue valsartan  as prescribed. - Use hydrochlorothiazide as needed for edema. - Order lab tests for kidney function and potassium levels. - Recheck blood pressure in 3 months. - Schedule physical exam in 3 months.

## 2024-01-25 NOTE — Progress Notes (Signed)
 Subjective:     Patient ID: Julie Frey, female    DOB: November 17, 1970, 53 y.o.   MRN: [id number]  Chief Complaint  Patient presents with   Hypertension    Here for follow up    HPI  Discussed the use of AI scribe software for clinical note transcription with the patient, who gave verbal consent to proceed.  History of Present Illness   Julie Frey is a 53 year old female with hypertension who presents for follow-up on blood pressure management.  Her blood pressure is 128/73 mmHg, showing improvement. Her medication regimen was recently adjusted, reducing amlodipine  from 10 mg to 5 mg daily and adding valsartan . Swelling in her legs has improved with this regimen, and her legs feel lighter.  She experiences knee pain, which she believes is unrelated to her blood pressure treatment. She uses hydrochlorothiazide as needed for swelling but did not take it on the day of the visit.  She has a blood pressure cuff at home but only checks her blood pressure when she feels unwell.    Health Maintenance Due  Topic Date Due   Hepatitis B Vaccines 19-59 Average Risk (1 of 3 - 19+ 3-dose series) Never done   Cervical Cancer Screening (HPV/Pap Cotest)  Never done   COVID-19 Vaccine (4 - 2024-25 season) 02/06/2023   Pneumococcal Vaccine: 50+ Years (1 of 1 - PCV) Never done   Zoster Vaccines- Shingrix (1 of 2) Never done   INFLUENZA VACCINE  01/06/2024    Past Medical History:  Diagnosis Date   Hypercholesterolemia    Hyperglycemia    Hypertension     Past Surgical History:  Procedure Laterality Date   HYSTERECTOMY ABDOMINAL WITH SALPINGECTOMY  2014    Family History  Problem Relation Age of Onset   Cancer - Colon Mother    Hypertension Father    Heart disease Father    Huntington's disease Father        huntingtons like illness   Hypertension Sister    Deep vein thrombosis Sister     Social History   Socioeconomic History   Marital status: Single     Spouse name: Not on file   Number of children: Not on file   Years of education: Not on file   Highest education level: Not on file  Occupational History   Not on file  Tobacco Use   Smoking status: Never   Smokeless tobacco: Never  Vaping Use   Vaping status: Never Used  Substance and Sexual Activity   Alcohol use: Yes    Alcohol/week: 2.0 standard drinks of alcohol    Types: 2 Glasses of wine per week    Comment: once a month   Drug use: Never   Sexual activity: Yes    Partners: Male  Other Topics Concern   Not on file  Social History Narrative   Single, niece lives with her   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 : Never true  Transportation Needs: No Transportation Needs (07/27/2023)   Received from [facility]   Transportation    In the past 12 months, has lack of reliable transportation kept you from medical appointments, meetings, work or from getting things needed for daily living? : No  Physical Activity: Not on file  Stress: Not on file  Social Connections: Unknown (10/18/2021)   Received from [facility]   Social Network    Social Network: Not on file  Intimate Partner Violence: Unknown (09/09/2021)   Received from [facility]   HITS    Physically Hurt: Not on file    Insult or Talk Down To: Not on file    Threaten Physical Harm: Not on file    Scream or Curse: Not on file    Outpatient Medications  Prior to Visit  Medication Sig Dispense Refill   amLODipine  (NORVASC ) 10 MG tablet Take 0.5 tablets (5 mg total) by mouth daily.     Evolocumab  (REPATHA  SURECLICK) 140 MG/ML SOAJ Inject 1 mL (140 mg total) into the skin every 14 (fourteen) days. 6 mL 4   hydrochlorothiazide (HYDRODIURIL) 25 MG tablet Take 25 mg by mouth daily.     valsartan  (DIOVAN ) 160 MG tablet Take 1 tablet (160 mg total) by mouth daily. 90 tablet 3   No facility-administered medications prior to visit.    No Known Allergies  ROS See HPI    Objective:    Physical Exam Constitutional:      General: She is not in acute distress.    Appearance: Normal appearance. She is well-developed.  HENT:     Head: Normocephalic and atraumatic.     Right Ear: External ear normal.     Left Ear: External ear normal.  Eyes:     General: No scleral icterus. Neck:     Thyroid : No thyromegaly.  Cardiovascular:     Rate and Rhythm: Normal rate and regular rhythm.     Heart sounds: Normal heart sounds. No murmur heard. Pulmonary:     Effort: Pulmonary effort is normal. No respiratory distress.     Breath sounds: Normal breath sounds. No wheezing.  Musculoskeletal:     Cervical back: Neck supple.  Skin:    General: Skin is warm and dry.  Neurological:     Mental Status: She is alert and oriented to person, place, and time.  Psychiatric:        Mood and Affect: Mood normal.        Behavior: Behavior normal.        Thought Content: Thought content normal.        Judgment: Judgment normal.      BP 128/73 (BP Location: Right Arm, Patient Position: Sitting, Cuff Size: Large)   Pulse 69   Temp 98.5 F (36.9 C) (Oral)   Resp 16   Ht 5' 6.5 (1.689 m)   Wt 258 lb (117 kg)   SpO2 100%   BMI 41.02 kg/m  Wt Readings from Last 3 Encounters:  01/25/24 258 lb (117 kg)  01/10/24 262 lb (118.8 kg)  08/12/18 250 lb (113.4 kg)       Assessment & Plan:   Problem List Items Addressed This Visit       Unprioritized    Primary hypertension - Primary    Hypertension controlled with  amlodipine  and valsartan . Edema improved with medication adjustment. Discussed valsartan 's rare potential to increase potassium. - Continue amlodipine  5 mg daily. - Continue valsartan  as prescribed. - Use hydrochlorothiazide as needed for edema. - Order lab tests for kidney function and potassium levels. - Recheck blood pressure in 3 months. - Schedule physical exam in 3 months.      Relevant Orders   Basic Metabolic Panel (BMET)    I am having [place] D. Jackquline maintain her Repatha  SureClick, hydrochlorothiazide, amLODipine , and valsartan .  No orders of the defined types were placed in this encounter.

## 2024-01-26 ENCOUNTER — Ambulatory Visit: Payer: Self-pay | Admitting: Family

## 2024-01-26 LAB — BASIC METABOLIC PANEL WITH GFR
BUN: 9 mg/dL (ref 6–23)
CO2: 26 meq/L (ref 19–32)
Calcium: 9.3 mg/dL (ref 8.4–10.5)
Chloride: 103 meq/L (ref 96–112)
Creatinine, Ser: 0.96 mg/dL (ref 0.40–1.20)
GFR: 68.88 mL/min (ref >60.00)
Glucose, Bld: 109 mg/dL — ABNORMAL HIGH (ref 70–99)
Potassium: 3.8 meq/L (ref 3.5–5.1)
Sodium: 139 meq/L (ref 135–145)

## 2024-02-02 ENCOUNTER — Inpatient Hospital Stay: Admission: RE | Admit: 2024-02-02 | Source: Ambulatory Visit

## 2024-02-02 DIAGNOSIS — Z1231 Encounter for screening mammogram for malignant neoplasm of breast: Secondary | ICD-10-CM

## 2024-02-21 ENCOUNTER — Ambulatory Visit
Admission: RE | Admit: 2024-02-21 | Discharge: 2024-02-21 | Disposition: A | Discharge status: Home / Self Care | Source: Ambulatory Visit | Attending: Family | Admitting: Family

## 2024-02-21 DIAGNOSIS — Z1231 Encounter for screening mammogram for malignant neoplasm of breast: Secondary | ICD-10-CM

## 2024-05-02 ENCOUNTER — Telehealth: Payer: Self-pay | Admitting: Family

## 2024-05-02 ENCOUNTER — Ambulatory Visit (INDEPENDENT_AMBULATORY_CARE_PROVIDER_SITE_OTHER): Admitting: Family

## 2024-05-02 ENCOUNTER — Encounter: Payer: Self-pay | Admitting: Family

## 2024-05-02 ENCOUNTER — Other Ambulatory Visit (HOSPITAL_BASED_OUTPATIENT_CLINIC_OR_DEPARTMENT_OTHER): Payer: Self-pay

## 2024-05-02 VITALS — BP 136/72 | HR 78 | Temp 98.1°F | Resp 16 | Ht 66.5 in | Wt 262.0 lb

## 2024-05-02 DIAGNOSIS — I10 Essential (primary) hypertension: Secondary | ICD-10-CM

## 2024-05-02 DIAGNOSIS — Z0000 Encounter for general adult medical examination without abnormal findings: Secondary | ICD-10-CM | POA: Insufficient documentation

## 2024-05-02 DIAGNOSIS — R739 Hyperglycemia, unspecified: Secondary | ICD-10-CM | POA: Diagnosis not present

## 2024-05-02 DIAGNOSIS — E785 Hyperlipidemia, unspecified: Secondary | ICD-10-CM | POA: Diagnosis not present

## 2024-05-02 MED ORDER — AMLODIPINE BESYLATE 5 MG PO TABS
5.0000 mg | ORAL_TABLET | Freq: Every day | ORAL | 1 refills | Status: AC
  Filled 2024-05-02: qty 90, 90d supply, fill #0

## 2024-05-02 NOTE — Telephone Encounter (Signed)
 Please disregard, patient had hysterectomy. Thanks.

## 2024-05-02 NOTE — Assessment & Plan Note (Signed)
  Flu shot received. Hepatitis B vaccination status unclear. Declined shingles and pneumonia vaccines today. Colonoscopy up to date. Mammogram normal in September. Vision and dental check-ups current. PAP smear history reviewed. - Will verify hepatitis B vaccination status. - Encouraged scheduling shingles vaccine for a weekend. - Encouraged follow-up with gynecologist for PAP smear. - Continue routine vision and dental check-ups.

## 2024-05-02 NOTE — Telephone Encounter (Signed)
 Noted

## 2024-05-02 NOTE — Assessment & Plan Note (Signed)
  Currently on Repatha , prescribed by cardiologist. No recent cholesterol check since starting Repatha . - Ordered cholesterol lab test. - Continue Repatha  as prescribed by cardiologist.

## 2024-05-02 NOTE — Telephone Encounter (Signed)
 Please request copy of Pap from Dr. Heron Rounds office.

## 2024-05-02 NOTE — Progress Notes (Signed)
 Subjective:     Patient ID: Julie Frey, female    DOB: 23-Dec-1970, 53 y.o.   MRN: [id number]  Chief Complaint  Patient presents with   Annual Exam    HPI  Discussed the use of AI scribe software for clinical note transcription with the patient, who gave verbal consent to proceed.  History of Present Illness Julie Frey is a 53 year old female who presents for an annual physical exam.  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 She declines the shingles and pneumonia vaccines at this time.  Her colonoscopy is up to date, having been completed in May 2025, with the next one due in May 2035. She has been under the care of a gynecologist for gynecological care, including a hysterectomy in 2014. Her mammogram in September was normal. She is current with vision and dental check-ups, with the next dental visit scheduled for January or February.  She is taking half of the amlodipine  10 mg, valsartan , and HCTZ as needed. She reports improved swelling with the adjustment of amlodipine . She continues to take Repatha , prescribed by her cardiologist, but has not had her cholesterol rechecked since starting it.  She reports a little cough and nasal drainage after traveling over the weekend. No skin concerns, hearing or vision issues, digestive problems, urinary issues, unusual muscle or joint pain, frequent headaches, or concerns about depression or anxiety.  Immunizations: declines prevnar and shingrix Diet:  improving Exercise: not exercising Colonoscopy: 5/25 due 5/35 Pap Smear: s/p hysterectomy Mammogram: 9/25- normal Vision: up to date Dental: up to date     Health Maintenance Due  Topic Date Due   Pneumococcal Vaccine: 50+ Years (1 of 1 - PCV) Never done   Zoster Vaccines- Shingrix (1 of 2) Never done   COVID-19 Vaccine (4 - 2025-26  season) 02/06/2024    Past Medical History:  Diagnosis Date   Hypercholesterolemia    Hyperglycemia    Hypertension     Past Surgical History:  Procedure Laterality Date   TOTAL LAPAROSCOPIC HYSTERECTOMY WITH SALPINGECTOMY  2014    Family History  Problem Relation Age of Onset   Cancer - Colon Mother    Hypertension Father    Heart disease Father    Huntington's disease Father        huntingtons like illness   Hypertension Sister    Deep vein thrombosis Sister     Social History   Socioeconomic History   Marital status: Single    Spouse name: Not on file   Number of children: Not on file   Years of education: Not on file   Highest education level: Not on file  Occupational History   Not on file  Tobacco Use   Smoking status: Never   Smokeless tobacco: Never  Vaping Use   Vaping status: Never Used  Substance and Sexual Activity   Alcohol use: Yes    Alcohol/week: 2.0 standard drinks of alcohol    Types: 2 Glasses of wine per week    Comment: once a month   Drug use: Never   Sexual activity: Yes    Partners: Male  Other Topics Concern   Not on file  Social History Narrative   Single, niece lives with her   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)   Cares for her father who is living  independently   No pets   Enjoys napping, spending time with family and friends   Completed Masters Training And Development Officer in [employer]   No pets   Social Drivers of Corporate Investment Banker Strain: Not on file  Food Insecurity: Low Risk  (07/27/2023)   Received from [facility]   Hunger Vital Sign    Within the past 12 months, you worried that your food would run out before you got money to buy more: Never true    Within the past 12 months, the food you bought just didn't last and you didn't have money to get more. : Never true  Transportation Needs: No Transportation Needs (07/27/2023)   Received from [facility]    In the past 12 months, has lack of  reliable transportation kept you from medical appointments, meetings, work or from getting things needed for daily living? : No  Physical Activity: Not on file  Stress: Not on file  Social Connections: Unknown (10/18/2021)   Received from [facility]   Social Network    Social Network: Not on file  Intimate Partner Violence: Unknown (09/09/2021)   Received from [facility]   HITS    Physically Hurt: Not on file    Insult or Talk Down To: Not on file    Threaten Physical Harm: Not on file    Scream or Curse: Not on file    Outpatient Medications Prior to Visit  Medication Sig Dispense Refill   Evolocumab  (REPATHA  SURECLICK) 140 MG/ML SOAJ Inject 1 mL (140 mg total) into the skin every 14 (fourteen) days. 6 mL 4   hydrochlorothiazide (HYDRODIURIL) 25 MG tablet Take 25 mg by mouth daily.     valsartan  (DIOVAN ) 160 MG tablet Take 1 tablet (160 mg total) by mouth daily. 90 tablet 3   amLODipine  (NORVASC ) 10 MG tablet Take 0.5 tablets (5 mg total) by mouth daily.     No facility-administered medications prior to visit.    No Known Allergies  Review of Systems  Constitutional:  Negative for weight loss.  HENT:  Positive for congestion (mild). Negative for hearing loss.   Eyes:  Negative for blurred vision.  Respiratory:  Positive for cough (mild).   Cardiovascular:  Positive for leg swelling.  Genitourinary:  Negative for dysuria and frequency.  Musculoskeletal:  Positive for myalgias. Negative for joint pain.  Skin:  Negative for rash.  Neurological:  Negative for headaches.  Psychiatric/Behavioral:  Negative for depression. The patient is not nervous/anxious.        Objective:    Physical Exam   BP 136/72 (BP Location: Right Arm, Patient Position: Sitting, Cuff Size: Large)   Pulse 78   Temp 98.1 F (36.7 C) (Oral)   Resp 16   Ht 5' 6.5 (1.689 m)   Wt 262 lb (118.8 kg)   SpO2 99%   BMI 41.65 kg/m  Wt Readings from Last 3 Encounters:  05/02/24 262 lb (118.8 kg)   01/25/24 258 lb (117 kg)  01/10/24 262 lb (118.8 kg)   Physical Exam  Constitutional: She is oriented to person, place, and time. She appears well-developed and well-nourished. No distress.  HENT:  Head: Normocephalic and atraumatic.  Right Ear: Tympanic membrane and ear canal normal.  Left Ear: Tympanic membrane and ear canal normal.  Mouth/Throat: Oropharynx is clear and moist.  Eyes: Pupils are equal, round, and reactive to light. No scleral icterus.  Neck: Normal range of motion. No thyromegaly present.  Cardiovascular: Normal rate and regular rhythm.   No murmur heard. Pulmonary/Chest: Effort normal and breath sounds normal. No respiratory distress. He has no wheezes. She has no rales. She exhibits no tenderness.  Abdominal: Soft. Bowel sounds are normal. She exhibits no distension and no mass. There is no tenderness. There is no rebound and no guarding.  Musculoskeletal: She exhibits no edema.  Lymphadenopathy:    She has no cervical adenopathy.  Neurological: She is alert and oriented to person, place, and time. She has normal patellar reflexes. She exhibits normal muscle tone. Coordination normal.  Skin: Skin is warm and dry.  Psychiatric: She has a normal mood and affect. Her behavior is normal. Judgment and thought content normal.  Breast/Pelvic: deferred to GYN           Assessment & Plan:       Assessment & Plan:   Problem List Items Addressed This Visit       Unprioritized   Primary hypertension    Blood pressure controlled at 136/72 mmHg. Swelling improved with amlodipine  adjustment. - Continue amlodipine  5 mg daily. - Continue valsartan  160 mg daily. - Sent amlodipine  prescription to pharmacy.       Relevant Medications   amLODipine  (NORVASC ) 5 MG tablet   Preventative health care - Primary    Flu shot received. Hepatitis B vaccination status unclear. Declined shingles and pneumonia vaccines today. Colonoscopy up to date. Mammogram normal in  September. Vision and dental check-ups current. PAP smear history reviewed. - Will verify hepatitis B vaccination status. - Encouraged scheduling shingles vaccine for a weekend. - Encouraged follow-up with gynecologist for PAP smear. - Continue routine vision and dental check-ups.       Hyperlipidemia    Currently on Repatha , prescribed by cardiologist. No recent cholesterol check since starting Repatha . - Ordered cholesterol lab test. - Continue Repatha  as prescribed by cardiologist.      Relevant Medications   amLODipine  (NORVASC ) 5 MG tablet   Other Relevant Orders   Lipid panel   Hyperglycemia   Relevant Orders   Basic Metabolic Panel (BMET)   HgB A1c   Assessment & Plan     I have discontinued Tyanna D. Akhavan's amLODipine . I am also having her start on amLODipine . Additionally, I am having her maintain her Repatha  SureClick, hydrochlorothiazide, and valsartan .  Meds ordered this encounter  Medications   amLODipine  (NORVASC ) 5 MG tablet    Sig: Take 1 tablet (5 mg total) by mouth daily.    Dispense:  90 tablet    Refill:  1    Supervising Provider:   DOMENICA BLACKBIRD A [[id number]]

## 2024-05-02 NOTE — Assessment & Plan Note (Signed)
  Blood pressure controlled at 136/72 mmHg. Swelling improved with amlodipine  adjustment. - Continue amlodipine  5 mg daily. - Continue valsartan  160 mg daily. - Sent amlodipine  prescription to pharmacy.

## 2024-05-14 ENCOUNTER — Other Ambulatory Visit (HOSPITAL_BASED_OUTPATIENT_CLINIC_OR_DEPARTMENT_OTHER): Payer: Self-pay

## 2024-05-18 ENCOUNTER — Other Ambulatory Visit (HOSPITAL_BASED_OUTPATIENT_CLINIC_OR_DEPARTMENT_OTHER): Payer: Self-pay

## 2024-10-30 ENCOUNTER — Ambulatory Visit: Admitting: Family

## 2025-05-08 ENCOUNTER — Encounter: Admitting: Family
